# Patient Record
Sex: Female | Born: 1985 | Race: Black or African American | Hispanic: No | Marital: Single | Smoking: Never smoker
Health system: Southern US, Community
[De-identification: ages and names within clinical notes are randomized; demographics above are authoritative.]

## PROBLEM LIST (undated history)

## (undated) HISTORY — PX: RETINAL DETACHMENT SURGERY: SHX105

---

## 2004-07-11 ENCOUNTER — Emergency Department (HOSPITAL_COMMUNITY): Admission: EM | Admit: 2004-07-11 | Discharge: 2004-07-12 | Payer: Self-pay | Admitting: Emergency Medicine

## 2004-07-26 ENCOUNTER — Ambulatory Visit: Payer: Self-pay | Admitting: Family Medicine

## 2004-08-09 ENCOUNTER — Ambulatory Visit: Payer: Self-pay | Admitting: Family Medicine

## 2004-08-30 ENCOUNTER — Ambulatory Visit: Payer: Self-pay | Admitting: Family Medicine

## 2004-10-19 ENCOUNTER — Ambulatory Visit: Payer: Self-pay | Admitting: Family Medicine

## 2005-07-11 ENCOUNTER — Encounter: Payer: Self-pay | Admitting: Family Medicine

## 2005-07-11 ENCOUNTER — Ambulatory Visit: Payer: Self-pay | Admitting: *Deleted

## 2006-07-23 ENCOUNTER — Ambulatory Visit: Payer: Self-pay | Admitting: Obstetrics and Gynecology

## 2006-11-06 ENCOUNTER — Ambulatory Visit: Payer: Self-pay | Admitting: Family Medicine

## 2007-08-22 ENCOUNTER — Emergency Department (HOSPITAL_COMMUNITY): Admission: EM | Admit: 2007-08-22 | Discharge: 2007-08-22 | Payer: Self-pay | Admitting: Emergency Medicine

## 2010-08-22 ENCOUNTER — Other Ambulatory Visit
Admission: RE | Admit: 2010-08-22 | Discharge: 2010-08-22 | Payer: Self-pay | Source: Home / Self Care | Admitting: Family Medicine

## 2011-01-11 NOTE — Group Therapy Note (Signed)
[name], Stein NO.:  [id number]   MEDICAL RECORD NO.:  [id number]          PATIENT TYPE:  WOC   LOCATION[facility]                   FACILITY:  [facility]   PHYSICIAN:  Tinnie Gens, MD        DATE OF BIRTH:  1986-06-20   DATE OF SERVICE:  07/26/2004                                    CLINIC NOTE   CHIEF COMPLAINT:  Genital warts.   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  She was seen there on July 11, 2004 with an outbreak.  She had  previously had a Pap smear in October 2005 and it was thought she had  chlamydia at that time.  However, her partner was never treated and a test  of cure was negative at the [facility] ED on November 16.   PAST MEDICAL HISTORY:  Negative.   PAST SURGICAL HISTORY:  Negative.   MEDICATIONS:  None.   ALLERGIES:  PENICILLIN.   GYNECOLOGICAL HISTORY:  Menarche at age 32, light flow, mild pain with  periods.  On oral contraceptives.  No history of abnormal Pap smear.   OBSTETRICAL HISTORY:  G0.   FAMILY HISTORY:  Grandmother has diabetes.   SOCIAL HISTORY:  No tobacco, alcohol, or drug use.   REVIEW OF SYMPTOMS:  A 14-point review of systems reviewed and negative.   PHYSICAL EXAMINATION:  VITAL SIGNS:  As noted in the chart, her pulse is  137, weight is 110.  GENERAL:  She is a thin black female in no acute distress.  GENITOURINARY:  She has pocketing of warts all along the posterior  fourchette that were treated with 80% TCA after covering of surrounding  areas with K-Y Jelly.  The patient was instructed to clean this off in  approximately 30 minutes.   IMPRESSION:  Genital warts.   PLAN:  Treatment today and return in 2 weeks for further treatment as  needed.      TP/MEDQ  D:  07/26/2004  T:  07/26/2004  Job:  [id number]

## 2011-01-11 NOTE — Group Therapy Note (Signed)
[name], [name] NO.:  [id number]   MEDICAL RECORD NO.:  [id number]          PATIENT TYPE:  WOC   [facility]                   [facility]   PHYSICIAN:  Argentina Donovan, MD        DATE OF BIRTH:  1985-10-03   DATE OF SERVICE:  07/23/2006                                  CLINIC NOTE   CHIEF COMPLAINT:  The patient is here for her Pap smear.   HISTORY OF PRESENT ILLNESS:  The patient has no complaints, is here for  a Pap smear.  Had a Pap smear one year ago that was normal.  Denies any  vaginal discharge.  No abnormal vaginal bleeding.  No nipple discharge.  No breast pain.  No GI symptoms.   PAST MEDICAL HISTORY:  Noncontributory.   ALLERGIES:  PENICILLIN.   GYN HISTORY:  Last menstrual period was 2 weeks ago.  Last Pap smear  November 2006.  Has never had a mammogram.  No history of abnormal Pap  smear.  Was no Depo-Provera last year, stopped earlier this year.  Has  been using condoms for contraception.  Does not want any other  contraceptive option at this time.   MEDICATIONS:  None.   SOCIAL HISTORY:  The patient is a Consulting civil engineer at [employer].  Denies tobacco use, alcohol or drugs.   PHYSICAL EXAMINATION:  VITAL SIGNS:  A pulse of 92, blood pressure of  111/74, weight of 124.  GENERAL:  The patient was alert, in no acute distress.  CARDIOVASCULAR:  Regular rate and rhythm, no murmurs.  LUNGS:  Clear to auscultation bilaterally.  ABDOMEN:  Soft, nontender, nondistended.  Appropriate bowel sounds.  PELVIC:  No vaginal discharge.  Cervix appeared normal.  No lesions.  No  adnexal mass or tenderness.  No cervical motion tenderness.  Uterus was  small, anteverted, mobile.   ASSESSMENT AND PLAN:  1. Health maintenance.  The patient had a Pap smear, gonorrhea and      Chlamydia done today.  Will plan to send a letter to the patient      regarding her test results.  2. Discussed contraceptive options with patient.  Patient  declined.      Patient wants to continue with condoms as needed for contraception.   The patient is to follow up in one year for routine exam unless she has  any problems prior to that.     ______________________________  Wilburt Finlay, M.D.    ______________________________  Argentina Donovan, MD    LJ/MEDQ  D:  07/23/2006  T:  07/24/2006  Job:  [id number]

## 2011-01-11 NOTE — Group Therapy Note (Signed)
NAMECHAELI, JUDY NO.:  [id number]   MEDICAL RECORD NO.:  [id number]          PATIENT TYPE:  WOC   [facility]                   [facility]   PHYSICIAN:  Kathlyn Sacramento, M.D.   DATE OF BIRTH:  11-11-1985   DATE OF SERVICE:                                    CLINIC NOTE   CHIEF COMPLAINT:  Here for a Pap smear.   HISTORY OF PRESENT ILLNESS:  The patient states that she is here for a Pap  smear.  She has not had a Pap in one year.  She also states that she has a  history of migraines which she used physical therapy for in the past which  made them less frequent, but she did have one recently.  She also states  that she was told in the past she had protein in her urine, but was  menstruating during this time.   ALLERGIES:  To penicillin.   GYNECOLOGICAL HISTORY:  Last menstrual period:  She is on Depo.  Last Pap  smear in October of 2005.  Last mammogram:  Never.  She has had no history  of abnormal Pap smears.  She has had a history of chlamydia.  She has had 3  lifetime partners, one in the past year, and is not currently sexually  active.  She is a G0.   CURRENT MEDICATIONS:  None except Depo.   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   PHYSICAL EXAMINATION:  VITAL SIGNS:  Her temp is 98.4.  Her pulse is 105.  Her blood pressure is 126/73.  Her weight is 118.4 pounds, and she is 5 feet  4-1/2 inches.  GENERAL:  The patient is a well-developed, well-nourished female in no acute  distress.  GU:  She has normal external genitalia.  Her vaginal mucosa is rugated and  pink.  She has a normal-appearing cervix which is nulliparous.  A Pap smear  was done.  She had no cervical motion tenderness.  She had no adnexal  masses.  No uterine enlargement.   ASSESSMENT AND PLAN:  1.  Migraines.  The patient was instructed to follow up with her primary      care physician for further evaluation of this.  2.   History of proteinuria.  The patient likely had proteinuria secondary to      her menstrual period likely because she was menstruating.  Again, the      patient was told to follow up with primary care physician for this.  3.  Health maintenance.  Pap smear, gonorrhea and chlamydia were done.      Please send a copy to the patient as she needs a copy of her Pap smear      to continue getting the Depo-Provera at her school clinic.          ______________________________  Kathlyn Sacramento, M.D.    AC/MEDQ  D:  07/11/2005  T:  07/11/2005  Job:  [id number]

## 2011-01-11 NOTE — Group Therapy Note (Signed)
[name], PROVENCAL NO.:  [id number]   MEDICAL RECORD NO.:  [id number]          PATIENT TYPE:  WOC   [facility]                   [facility]   PHYSICIAN:  Tinnie Gens, MD        DATE OF BIRTH:  Jun 26, 1986   DATE OF SERVICE:  11/06/2006                                  CLINIC NOTE   CHIEF COMPLAINT:  Test of cure after testing positive for Gonorrhea.   HISTORY OF PRESENT ILLNESS:  The patient is a 25 year old nulligravida  who underwent annual exam with Pap smear in November 2007 which was  normal except her GC was positive.  She was treated with Cipro.  The  patient is here and back for a test of cure today.  She again declines  oral contraceptives.   PHYSICAL EXAMINATION:  VITAL SIGNS:  On exam, her vitals are as noted on  the chart.  GENERAL:  She is a well-developed, well-nourished female in no acute  distress.  ABDOMEN:  Soft, nontender, nondistended.  GU:  Normal external female genitalia.  Vagina is pink and rugated.  Cervix is nulliparous without lesion.  EXTREMITIES:  Also, on exam, on her right wrist she has a ganglion cyst.   IMPRESSION:  1. Sexual behavior resulting in positive Gonorrhea Chlamydia.  I gave      her a test of cure.  2. Ganglion cyst.   PLAN:  1. GC Chlamydia testing today.  I offered HIV and Syphilis; she      declines.  2. We will refer her to a hand surgeon to take off her ganglion.           ______________________________  Tinnie Gens, MD     TP/MEDQ  D:  11/06/2006  T:  11/08/2006  Job:  [id number]

## 2011-01-11 NOTE — Group Therapy Note (Signed)
[name] NO.:  [id number]   MEDICAL RECORD NO.:  [id number]          PATIENT TYPE:  WOC   [facility]                   [facility]   PHYSICIAN:  Tinnie Gens, MD        DATE OF BIRTH:  01-Nov-1985   DATE OF SERVICE:                                    CLINIC NOTE   CHIEF COMPLAINT:  STD check, birth-control consult.   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  She  was previously treated for genital warts which have resolved.  She wants a  complete STD check.  She has also been off birth control pills, and would  like to discuss other methods.   PHYSICAL EXAMINATION:  VITAL SIGNS:  Her vitals are as noted on the chart.  GENERAL:  She is a well-developed, well-nourished black female in no acute  distress.  GU:  Normal external female genitalia.  The vagina is pink and rugated.  The  cervix is nulliparous and without  lesions.  The uterus is small and  anteverted.  There is no significant cervical motion tenderness.   IMPRESSION:  1.  High-risk sexual behavior.  2.  Birth-control consult.   PLAN:  1.  GC and chlamydia screen today.  The patient did have a negative HIV in      December so we will not repeat that today.  2.  I discussed Nuva ring, birth control pill, Depo-Provera and the patch      with the patient.  She is comfortable using the pill, has been on it      before, and will start after her next normal cycle.  3.  The patient was instructed to return with any further needs that we can      assist with.      TP/MEDQ  D:  10/19/2004  T:  10/19/2004  Job:  [id number]

## 2011-05-31 LAB — I-STAT 8, (EC8 V) (CONVERTED LAB)
BUN: 7
Bicarbonate: 26.8 — ABNORMAL HIGH
HCT: 49 — ABNORMAL HIGH
Operator id: 257131
pCO2, Ven: 47.5
pH, Ven: 7.359 — ABNORMAL HIGH

## 2011-05-31 LAB — DIFFERENTIAL
Basophils Absolute: 0
Eosinophils Absolute: 0.2
Eosinophils Relative: 1
Monocytes Absolute: 0.7

## 2011-05-31 LAB — URINALYSIS, ROUTINE W REFLEX MICROSCOPIC
Hgb urine dipstick: NEGATIVE
Ketones, ur: 15 — AB
Protein, ur: NEGATIVE
Urobilinogen, UA: 0.2

## 2011-05-31 LAB — POCT I-STAT CREATININE
Creatinine, Ser: 0.9
Operator id: 257131

## 2011-05-31 LAB — GC/CHLAMYDIA PROBE AMP, GENITAL
Chlamydia, DNA Probe: NEGATIVE
GC Probe Amp, Genital: POSITIVE — AB

## 2011-05-31 LAB — RPR
RPR Ser Ql: NONREACTIVE
RPR Ser Ql: NONREACTIVE

## 2011-05-31 LAB — WET PREP, GENITAL: Yeast Wet Prep HPF POC: NONE SEEN

## 2011-05-31 LAB — CBC
HCT: 41.5
MCV: 87.7
Platelets: 457 — ABNORMAL HIGH
RDW: 13.4

## 2011-09-16 ENCOUNTER — Other Ambulatory Visit (HOSPITAL_COMMUNITY)
Admission: RE | Admit: 2011-09-16 | Discharge: 2011-09-16 | Disposition: A | Discharge status: Home / Self Care | Payer: BC Managed Care – PPO | Source: Ambulatory Visit | Attending: Family Medicine | Admitting: Family Medicine

## 2011-09-16 DIAGNOSIS — Z124 Encounter for screening for malignant neoplasm of cervix: Secondary | ICD-10-CM | POA: Insufficient documentation

## 2012-06-30 ENCOUNTER — Other Ambulatory Visit: Payer: Self-pay | Admitting: Family Medicine

## 2012-07-17 ENCOUNTER — Ambulatory Visit
Admission: RE | Admit: 2012-07-17 | Discharge: 2012-07-17 | Disposition: A | Discharge status: Home / Self Care | Payer: BC Managed Care – PPO | Source: Ambulatory Visit | Attending: Family Medicine | Admitting: Family Medicine

## 2013-09-20 ENCOUNTER — Other Ambulatory Visit (HOSPITAL_COMMUNITY)
Admission: RE | Admit: 2013-09-20 | Discharge: 2013-09-20 | Disposition: A | Discharge status: Home / Self Care | Payer: BC Managed Care – PPO | Source: Ambulatory Visit | Attending: Family Medicine | Admitting: Family Medicine

## 2013-09-20 ENCOUNTER — Other Ambulatory Visit: Payer: Self-pay | Admitting: Family Medicine

## 2013-09-20 DIAGNOSIS — Z124 Encounter for screening for malignant neoplasm of cervix: Secondary | ICD-10-CM | POA: Insufficient documentation
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
## 2016-01-01 DIAGNOSIS — E559 Vitamin D deficiency, unspecified: Secondary | ICD-10-CM | POA: Diagnosis not present

## 2016-05-09 DIAGNOSIS — Z23 Encounter for immunization: Secondary | ICD-10-CM | POA: Diagnosis not present

## 2016-05-24 ENCOUNTER — Encounter: Payer: No Typology Code available for payment source | Admitting: Internal Medicine

## 2016-09-05 DIAGNOSIS — F4323 Adjustment disorder with mixed anxiety and depressed mood: Secondary | ICD-10-CM | POA: Diagnosis not present

## 2016-09-20 DIAGNOSIS — F4323 Adjustment disorder with mixed anxiety and depressed mood: Secondary | ICD-10-CM | POA: Diagnosis not present

## 2016-09-30 DIAGNOSIS — F4323 Adjustment disorder with mixed anxiety and depressed mood: Secondary | ICD-10-CM | POA: Diagnosis not present

## 2016-10-03 ENCOUNTER — Other Ambulatory Visit: Payer: Self-pay | Admitting: Family Medicine

## 2016-10-03 ENCOUNTER — Other Ambulatory Visit (HOSPITAL_COMMUNITY)
Admission: RE | Admit: 2016-10-03 | Discharge: 2016-10-03 | Disposition: A | Discharge status: Home / Self Care | Payer: BLUE CROSS/BLUE SHIELD | Source: Ambulatory Visit | Attending: Family Medicine | Admitting: Family Medicine

## 2016-10-03 DIAGNOSIS — Z1151 Encounter for screening for human papillomavirus (HPV): Secondary | ICD-10-CM | POA: Diagnosis not present

## 2016-10-03 DIAGNOSIS — Z800 Family history of malignant neoplasm of digestive organs: Secondary | ICD-10-CM | POA: Diagnosis not present

## 2016-10-03 DIAGNOSIS — Z01411 Encounter for gynecological examination (general) (routine) with abnormal findings: Secondary | ICD-10-CM | POA: Diagnosis not present

## 2016-10-03 DIAGNOSIS — Z0000 Encounter for general adult medical examination without abnormal findings: Secondary | ICD-10-CM | POA: Diagnosis not present

## 2016-10-03 DIAGNOSIS — D473 Essential (hemorrhagic) thrombocythemia: Secondary | ICD-10-CM | POA: Diagnosis not present

## 2016-10-03 DIAGNOSIS — E559 Vitamin D deficiency, unspecified: Secondary | ICD-10-CM | POA: Diagnosis not present

## 2016-10-08 LAB — CYTOLOGY - PAP
Diagnosis: NEGATIVE
HPV (WINDOPATH): NOT DETECTED

## 2016-10-14 DIAGNOSIS — H5213 Myopia, bilateral: Secondary | ICD-10-CM | POA: Diagnosis not present

## 2016-10-14 DIAGNOSIS — H52223 Regular astigmatism, bilateral: Secondary | ICD-10-CM | POA: Diagnosis not present

## 2016-10-17 DIAGNOSIS — F4323 Adjustment disorder with mixed anxiety and depressed mood: Secondary | ICD-10-CM | POA: Diagnosis not present

## 2016-11-14 DIAGNOSIS — Z23 Encounter for immunization: Secondary | ICD-10-CM | POA: Diagnosis not present

## 2016-11-20 DIAGNOSIS — D473 Essential (hemorrhagic) thrombocythemia: Secondary | ICD-10-CM | POA: Diagnosis not present

## 2017-04-22 DIAGNOSIS — L918 Other hypertrophic disorders of the skin: Secondary | ICD-10-CM | POA: Diagnosis not present

## 2017-04-22 DIAGNOSIS — L819 Disorder of pigmentation, unspecified: Secondary | ICD-10-CM | POA: Diagnosis not present

## 2017-09-15 DIAGNOSIS — K921 Melena: Secondary | ICD-10-CM | POA: Diagnosis not present

## 2017-09-25 DIAGNOSIS — R195 Other fecal abnormalities: Secondary | ICD-10-CM | POA: Diagnosis not present

## 2017-10-06 DIAGNOSIS — Z0000 Encounter for general adult medical examination without abnormal findings: Secondary | ICD-10-CM | POA: Diagnosis not present

## 2017-10-06 DIAGNOSIS — E559 Vitamin D deficiency, unspecified: Secondary | ICD-10-CM | POA: Diagnosis not present

## 2017-10-28 DIAGNOSIS — R195 Other fecal abnormalities: Secondary | ICD-10-CM | POA: Diagnosis not present

## 2017-11-17 DIAGNOSIS — R195 Other fecal abnormalities: Secondary | ICD-10-CM | POA: Diagnosis not present

## 2017-11-21 DIAGNOSIS — R195 Other fecal abnormalities: Secondary | ICD-10-CM | POA: Diagnosis not present

## 2017-11-21 DIAGNOSIS — K648 Other hemorrhoids: Secondary | ICD-10-CM | POA: Diagnosis not present

## 2017-11-21 DIAGNOSIS — K635 Polyp of colon: Secondary | ICD-10-CM | POA: Diagnosis not present

## 2017-11-25 DIAGNOSIS — K635 Polyp of colon: Secondary | ICD-10-CM | POA: Diagnosis not present

## 2018-01-02 DIAGNOSIS — H52223 Regular astigmatism, bilateral: Secondary | ICD-10-CM | POA: Diagnosis not present

## 2018-01-02 DIAGNOSIS — H5213 Myopia, bilateral: Secondary | ICD-10-CM | POA: Diagnosis not present

## 2019-12-06 ENCOUNTER — Other Ambulatory Visit: Payer: Self-pay | Admitting: Family Medicine

## 2019-12-06 ENCOUNTER — Other Ambulatory Visit (HOSPITAL_COMMUNITY)
Admission: RE | Admit: 2019-12-06 | Discharge: 2019-12-06 | Disposition: A | Discharge status: Home / Self Care | Payer: PRIVATE HEALTH INSURANCE | Source: Ambulatory Visit | Attending: Family Medicine | Admitting: Family Medicine

## 2019-12-06 DIAGNOSIS — Z01411 Encounter for gynecological examination (general) (routine) with abnormal findings: Secondary | ICD-10-CM | POA: Insufficient documentation

## 2019-12-09 LAB — CYTOLOGY - PAP: Diagnosis: NEGATIVE

## 2020-10-01 DIAGNOSIS — J208 Acute bronchitis due to other specified organisms: Secondary | ICD-10-CM | POA: Diagnosis not present

## 2020-10-03 DIAGNOSIS — R051 Acute cough: Secondary | ICD-10-CM | POA: Diagnosis not present

## 2020-12-11 DIAGNOSIS — F4320 Adjustment disorder, unspecified: Secondary | ICD-10-CM | POA: Diagnosis not present

## 2020-12-11 DIAGNOSIS — Z630 Problems in relationship with spouse or partner: Secondary | ICD-10-CM | POA: Diagnosis not present

## 2020-12-18 DIAGNOSIS — F4320 Adjustment disorder, unspecified: Secondary | ICD-10-CM | POA: Diagnosis not present

## 2020-12-18 DIAGNOSIS — Z630 Problems in relationship with spouse or partner: Secondary | ICD-10-CM | POA: Diagnosis not present

## 2020-12-25 DIAGNOSIS — Z630 Problems in relationship with spouse or partner: Secondary | ICD-10-CM | POA: Diagnosis not present

## 2020-12-25 DIAGNOSIS — F4320 Adjustment disorder, unspecified: Secondary | ICD-10-CM | POA: Diagnosis not present

## 2021-01-01 DIAGNOSIS — Z23 Encounter for immunization: Secondary | ICD-10-CM | POA: Diagnosis not present

## 2021-01-01 DIAGNOSIS — Z0000 Encounter for general adult medical examination without abnormal findings: Secondary | ICD-10-CM | POA: Diagnosis not present

## 2021-01-01 DIAGNOSIS — Z13220 Encounter for screening for lipoid disorders: Secondary | ICD-10-CM | POA: Diagnosis not present

## 2021-01-01 DIAGNOSIS — E559 Vitamin D deficiency, unspecified: Secondary | ICD-10-CM | POA: Diagnosis not present

## 2021-01-02 DIAGNOSIS — F4320 Adjustment disorder, unspecified: Secondary | ICD-10-CM | POA: Diagnosis not present

## 2021-01-02 DIAGNOSIS — Z630 Problems in relationship with spouse or partner: Secondary | ICD-10-CM | POA: Diagnosis not present

## 2021-01-08 DIAGNOSIS — Z630 Problems in relationship with spouse or partner: Secondary | ICD-10-CM | POA: Diagnosis not present

## 2021-01-08 DIAGNOSIS — F4320 Adjustment disorder, unspecified: Secondary | ICD-10-CM | POA: Diagnosis not present

## 2021-01-22 DIAGNOSIS — Z630 Problems in relationship with spouse or partner: Secondary | ICD-10-CM | POA: Diagnosis not present

## 2021-01-22 DIAGNOSIS — F4320 Adjustment disorder, unspecified: Secondary | ICD-10-CM | POA: Diagnosis not present

## 2021-01-29 DIAGNOSIS — F4320 Adjustment disorder, unspecified: Secondary | ICD-10-CM | POA: Diagnosis not present

## 2021-01-29 DIAGNOSIS — Z630 Problems in relationship with spouse or partner: Secondary | ICD-10-CM | POA: Diagnosis not present

## 2021-02-05 DIAGNOSIS — Z630 Problems in relationship with spouse or partner: Secondary | ICD-10-CM | POA: Diagnosis not present

## 2021-02-05 DIAGNOSIS — F4320 Adjustment disorder, unspecified: Secondary | ICD-10-CM | POA: Diagnosis not present

## 2021-02-12 DIAGNOSIS — Z630 Problems in relationship with spouse or partner: Secondary | ICD-10-CM | POA: Diagnosis not present

## 2021-02-12 DIAGNOSIS — F4320 Adjustment disorder, unspecified: Secondary | ICD-10-CM | POA: Diagnosis not present

## 2021-02-21 DIAGNOSIS — F4320 Adjustment disorder, unspecified: Secondary | ICD-10-CM | POA: Diagnosis not present

## 2021-02-21 DIAGNOSIS — Z630 Problems in relationship with spouse or partner: Secondary | ICD-10-CM | POA: Diagnosis not present

## 2021-03-01 DIAGNOSIS — F4320 Adjustment disorder, unspecified: Secondary | ICD-10-CM | POA: Diagnosis not present

## 2021-03-01 DIAGNOSIS — Z630 Problems in relationship with spouse or partner: Secondary | ICD-10-CM | POA: Diagnosis not present

## 2021-03-05 DIAGNOSIS — F4320 Adjustment disorder, unspecified: Secondary | ICD-10-CM | POA: Diagnosis not present

## 2021-03-05 DIAGNOSIS — Z630 Problems in relationship with spouse or partner: Secondary | ICD-10-CM | POA: Diagnosis not present

## 2021-03-12 DIAGNOSIS — Z630 Problems in relationship with spouse or partner: Secondary | ICD-10-CM | POA: Diagnosis not present

## 2021-03-12 DIAGNOSIS — F4320 Adjustment disorder, unspecified: Secondary | ICD-10-CM | POA: Diagnosis not present

## 2021-04-09 DIAGNOSIS — F4320 Adjustment disorder, unspecified: Secondary | ICD-10-CM | POA: Diagnosis not present

## 2021-04-16 DIAGNOSIS — F4320 Adjustment disorder, unspecified: Secondary | ICD-10-CM | POA: Diagnosis not present

## 2021-04-25 DIAGNOSIS — F4320 Adjustment disorder, unspecified: Secondary | ICD-10-CM | POA: Diagnosis not present

## 2021-05-02 DIAGNOSIS — F4320 Adjustment disorder, unspecified: Secondary | ICD-10-CM | POA: Diagnosis not present

## 2021-05-09 DIAGNOSIS — F4320 Adjustment disorder, unspecified: Secondary | ICD-10-CM | POA: Diagnosis not present

## 2021-05-16 DIAGNOSIS — F4320 Adjustment disorder, unspecified: Secondary | ICD-10-CM | POA: Diagnosis not present

## 2021-05-23 DIAGNOSIS — F4320 Adjustment disorder, unspecified: Secondary | ICD-10-CM | POA: Diagnosis not present

## 2021-05-30 DIAGNOSIS — F4320 Adjustment disorder, unspecified: Secondary | ICD-10-CM | POA: Diagnosis not present

## 2021-06-04 DIAGNOSIS — F4320 Adjustment disorder, unspecified: Secondary | ICD-10-CM | POA: Diagnosis not present

## 2021-06-11 DIAGNOSIS — F4320 Adjustment disorder, unspecified: Secondary | ICD-10-CM | POA: Diagnosis not present

## 2021-06-18 DIAGNOSIS — F4320 Adjustment disorder, unspecified: Secondary | ICD-10-CM | POA: Diagnosis not present

## 2021-06-25 DIAGNOSIS — F4320 Adjustment disorder, unspecified: Secondary | ICD-10-CM | POA: Diagnosis not present

## 2021-07-02 DIAGNOSIS — F4320 Adjustment disorder, unspecified: Secondary | ICD-10-CM | POA: Diagnosis not present

## 2021-07-11 DIAGNOSIS — F4320 Adjustment disorder, unspecified: Secondary | ICD-10-CM | POA: Diagnosis not present

## 2021-07-30 DIAGNOSIS — F4320 Adjustment disorder, unspecified: Secondary | ICD-10-CM | POA: Diagnosis not present

## 2021-08-09 DIAGNOSIS — F4320 Adjustment disorder, unspecified: Secondary | ICD-10-CM | POA: Diagnosis not present

## 2021-08-13 DIAGNOSIS — F4320 Adjustment disorder, unspecified: Secondary | ICD-10-CM | POA: Diagnosis not present

## 2021-08-30 DIAGNOSIS — F4320 Adjustment disorder, unspecified: Secondary | ICD-10-CM | POA: Diagnosis not present

## 2021-09-06 DIAGNOSIS — F4320 Adjustment disorder, unspecified: Secondary | ICD-10-CM | POA: Diagnosis not present

## 2021-09-13 DIAGNOSIS — F4320 Adjustment disorder, unspecified: Secondary | ICD-10-CM | POA: Diagnosis not present

## 2021-09-17 DIAGNOSIS — F4320 Adjustment disorder, unspecified: Secondary | ICD-10-CM | POA: Diagnosis not present

## 2021-09-24 DIAGNOSIS — F4320 Adjustment disorder, unspecified: Secondary | ICD-10-CM | POA: Diagnosis not present

## 2021-10-01 DIAGNOSIS — F4320 Adjustment disorder, unspecified: Secondary | ICD-10-CM | POA: Diagnosis not present

## 2021-10-08 DIAGNOSIS — F4320 Adjustment disorder, unspecified: Secondary | ICD-10-CM | POA: Diagnosis not present

## 2021-10-15 DIAGNOSIS — F4320 Adjustment disorder, unspecified: Secondary | ICD-10-CM | POA: Diagnosis not present

## 2021-10-22 DIAGNOSIS — F4320 Adjustment disorder, unspecified: Secondary | ICD-10-CM | POA: Diagnosis not present

## 2021-10-31 DIAGNOSIS — F4320 Adjustment disorder, unspecified: Secondary | ICD-10-CM | POA: Diagnosis not present

## 2021-11-05 DIAGNOSIS — F4320 Adjustment disorder, unspecified: Secondary | ICD-10-CM | POA: Diagnosis not present

## 2021-11-14 DIAGNOSIS — F4320 Adjustment disorder, unspecified: Secondary | ICD-10-CM | POA: Diagnosis not present

## 2021-11-20 DIAGNOSIS — F4320 Adjustment disorder, unspecified: Secondary | ICD-10-CM | POA: Diagnosis not present

## 2021-11-27 DIAGNOSIS — F4320 Adjustment disorder, unspecified: Secondary | ICD-10-CM | POA: Diagnosis not present

## 2021-12-03 DIAGNOSIS — F4323 Adjustment disorder with mixed anxiety and depressed mood: Secondary | ICD-10-CM | POA: Diagnosis not present

## 2021-12-03 DIAGNOSIS — F4320 Adjustment disorder, unspecified: Secondary | ICD-10-CM | POA: Diagnosis not present

## 2021-12-11 DIAGNOSIS — F4320 Adjustment disorder, unspecified: Secondary | ICD-10-CM | POA: Diagnosis not present

## 2021-12-20 DIAGNOSIS — F4320 Adjustment disorder, unspecified: Secondary | ICD-10-CM | POA: Diagnosis not present

## 2021-12-24 DIAGNOSIS — F4320 Adjustment disorder, unspecified: Secondary | ICD-10-CM | POA: Diagnosis not present

## 2021-12-31 DIAGNOSIS — F4323 Adjustment disorder with mixed anxiety and depressed mood: Secondary | ICD-10-CM | POA: Diagnosis not present

## 2022-01-02 DIAGNOSIS — F4320 Adjustment disorder, unspecified: Secondary | ICD-10-CM | POA: Diagnosis not present

## 2022-01-07 DIAGNOSIS — F4323 Adjustment disorder with mixed anxiety and depressed mood: Secondary | ICD-10-CM | POA: Diagnosis not present

## 2022-01-14 DIAGNOSIS — F4320 Adjustment disorder, unspecified: Secondary | ICD-10-CM | POA: Diagnosis not present

## 2022-01-14 DIAGNOSIS — F4323 Adjustment disorder with mixed anxiety and depressed mood: Secondary | ICD-10-CM | POA: Diagnosis not present

## 2022-01-16 DIAGNOSIS — Z13220 Encounter for screening for lipoid disorders: Secondary | ICD-10-CM | POA: Diagnosis not present

## 2022-01-16 DIAGNOSIS — Z0000 Encounter for general adult medical examination without abnormal findings: Secondary | ICD-10-CM | POA: Diagnosis not present

## 2022-01-16 DIAGNOSIS — E559 Vitamin D deficiency, unspecified: Secondary | ICD-10-CM | POA: Diagnosis not present

## 2022-01-16 DIAGNOSIS — D649 Anemia, unspecified: Secondary | ICD-10-CM | POA: Diagnosis not present

## 2022-01-22 DIAGNOSIS — F4323 Adjustment disorder with mixed anxiety and depressed mood: Secondary | ICD-10-CM | POA: Diagnosis not present

## 2022-01-23 DIAGNOSIS — F4320 Adjustment disorder, unspecified: Secondary | ICD-10-CM | POA: Diagnosis not present

## 2022-02-04 DIAGNOSIS — F4320 Adjustment disorder, unspecified: Secondary | ICD-10-CM | POA: Diagnosis not present

## 2022-02-11 DIAGNOSIS — F4320 Adjustment disorder, unspecified: Secondary | ICD-10-CM | POA: Diagnosis not present

## 2022-03-04 DIAGNOSIS — F4320 Adjustment disorder, unspecified: Secondary | ICD-10-CM | POA: Diagnosis not present

## 2022-03-25 DIAGNOSIS — F4320 Adjustment disorder, unspecified: Secondary | ICD-10-CM | POA: Diagnosis not present

## 2022-04-01 DIAGNOSIS — H6122 Impacted cerumen, left ear: Secondary | ICD-10-CM | POA: Diagnosis not present

## 2022-04-08 DIAGNOSIS — F4320 Adjustment disorder, unspecified: Secondary | ICD-10-CM | POA: Diagnosis not present

## 2022-05-20 DIAGNOSIS — F4320 Adjustment disorder, unspecified: Secondary | ICD-10-CM | POA: Diagnosis not present

## 2022-06-10 DIAGNOSIS — F4320 Adjustment disorder, unspecified: Secondary | ICD-10-CM | POA: Diagnosis not present

## 2022-07-01 DIAGNOSIS — F4320 Adjustment disorder, unspecified: Secondary | ICD-10-CM | POA: Diagnosis not present

## 2022-07-29 DIAGNOSIS — F4320 Adjustment disorder, unspecified: Secondary | ICD-10-CM | POA: Diagnosis not present

## 2022-09-02 DIAGNOSIS — F4320 Adjustment disorder, unspecified: Secondary | ICD-10-CM | POA: Diagnosis not present

## 2022-09-30 DIAGNOSIS — F4320 Adjustment disorder, unspecified: Secondary | ICD-10-CM | POA: Diagnosis not present

## 2022-10-28 DIAGNOSIS — F4320 Adjustment disorder, unspecified: Secondary | ICD-10-CM | POA: Diagnosis not present

## 2022-11-27 DIAGNOSIS — F4320 Adjustment disorder, unspecified: Secondary | ICD-10-CM | POA: Diagnosis not present

## 2022-12-30 DIAGNOSIS — F4320 Adjustment disorder, unspecified: Secondary | ICD-10-CM | POA: Diagnosis not present

## 2023-01-27 DIAGNOSIS — F4320 Adjustment disorder, unspecified: Secondary | ICD-10-CM | POA: Diagnosis not present

## 2023-02-07 DIAGNOSIS — F4320 Adjustment disorder, unspecified: Secondary | ICD-10-CM | POA: Diagnosis not present

## 2023-02-13 DIAGNOSIS — E559 Vitamin D deficiency, unspecified: Secondary | ICD-10-CM | POA: Diagnosis not present

## 2023-02-13 DIAGNOSIS — Z13220 Encounter for screening for lipoid disorders: Secondary | ICD-10-CM | POA: Diagnosis not present

## 2023-02-13 DIAGNOSIS — D649 Anemia, unspecified: Secondary | ICD-10-CM | POA: Diagnosis not present

## 2023-02-13 DIAGNOSIS — F4320 Adjustment disorder, unspecified: Secondary | ICD-10-CM | POA: Diagnosis not present

## 2023-02-13 DIAGNOSIS — Z0000 Encounter for general adult medical examination without abnormal findings: Secondary | ICD-10-CM | POA: Diagnosis not present

## 2023-02-14 DIAGNOSIS — F4320 Adjustment disorder, unspecified: Secondary | ICD-10-CM | POA: Diagnosis not present

## 2023-02-17 ENCOUNTER — Other Ambulatory Visit: Payer: Self-pay | Admitting: Family Medicine

## 2023-02-17 DIAGNOSIS — N946 Dysmenorrhea, unspecified: Secondary | ICD-10-CM

## 2023-03-05 ENCOUNTER — Ambulatory Visit
Admission: RE | Admit: 2023-03-05 | Discharge: 2023-03-05 | Disposition: A | Discharge status: Home / Self Care | Payer: 59 | Source: Ambulatory Visit | Attending: Family Medicine | Admitting: Family Medicine

## 2023-03-05 DIAGNOSIS — N946 Dysmenorrhea, unspecified: Secondary | ICD-10-CM

## 2023-03-20 ENCOUNTER — Inpatient Hospital Stay: Payer: Managed Care, Other (non HMO)

## 2023-03-20 ENCOUNTER — Encounter: Payer: Self-pay | Admitting: Genetic Counselor

## 2023-03-20 ENCOUNTER — Inpatient Hospital Stay: Payer: 59 | Attending: Genetic Counselor | Admitting: Genetic Counselor

## 2023-03-20 DIAGNOSIS — Z800 Family history of malignant neoplasm of digestive organs: Secondary | ICD-10-CM

## 2023-03-20 DIAGNOSIS — Z803 Family history of malignant neoplasm of breast: Secondary | ICD-10-CM

## 2023-03-20 NOTE — Progress Notes (Signed)
REFERRING PROVIDER: Mila Palmer, MD   PRIMARY PROVIDER:  Patient, [name]  PRIMARY REASON FOR VISIT:  Encounter Diagnoses  Name Primary?   Family history of breast cancer Yes   Family history of stomach cancer    HISTORY OF PRESENT ILLNESS:   [name], a 37 y.o. female, was seen for a [place] cancer genetics consultation at the request of Dr. Paulino Rily due to a family history of cancer.  [name] presents to clinic today to discuss the possibility of a hereditary predisposition to cancer, to discuss genetic testing, and to further clarify her future cancer risks, as well as potential cancer risks for family members.   [name] is a 37 y.o. female with no personal history of cancer.    RISK FACTORS:  Menarche was at age 39.  First live birth at age n/a.  OCP use for approximately 2-3 years.  Ovaries intact: yes.  Uterus intact: yes.  Menopausal status: premenopausal.  HRT use: 0 years. Colonoscopy: yes;  reports benign polyps . Mammogram within the last year: no. Number of breast biopsies: 0. Any excessive radiation exposure in the past: no  Social History   Socioeconomic History   Marital status: Single    Spouse name: Not on file   Number of children: Not on file   Years of education: Not on file   Highest education level: Not on file  Occupational History   Not on file  Tobacco Use   Smoking status: Not on file   Smokeless tobacco: Not on file  Substance and Sexual Activity   Alcohol use: Not on file   Drug use: Not on file   Sexual activity: Not on file  Other Topics Concern   Not on file  Social History Narrative   Not on file   Social Determinants of Health   Financial Resource Strain: Not on file  Food Insecurity: Not on file  Transportation Needs: Not on file  Physical Activity: Not on file  Stress: Not on file  Social Connections: Unknown (01/08/2022)   Received from [facility]   Social Network    Social  Network: Not on file     FAMILY HISTORY:  We obtained a detailed, 4-generation family history.  Significant diagnoses are listed below: Family History  Problem Relation Age of Onset   Stomach cancer Father 84   Breast cancer Maternal Aunt 51       Negative genetic testing   Breast cancer Other 7       maternal half-aunt   Prostate cancer Other        maternal great uncle   Cancer Maternal Great-grandmother        unknown type      GENETIC COUNSELING ASSESSMENT: [name] is a 37 y.o. female with a family history of cancer which is somewhat suggestive of a hereditary predisposition to cancer. We, therefore, discussed and recommended the following at today's visit.   DISCUSSION: We discussed that 5 - 10% of cancer is hereditary, with most cases of hereditary breast cancer associated with BRCA1/2.  There are other genes that can be associated with hereditary breast cancer syndromes.  We discussed that testing is beneficial for several reasons, including knowing about other cancer risks, identifying potential screening and risk-reduction options that may be appropriate, and to understanding if other family members could be at risk for cancer and allowing them to undergo genetic testing.  We reviewed the characteristics, features and inheritance patterns of hereditary  cancer syndromes. We also discussed genetic testing, including the appropriate family members to test, the process of testing, insurance coverage and turn-around-time for results. We discussed the implications of a negative, positive, carrier and/or variant of uncertain significant result. We discussed that negative results would be uninformative given that [name] does not have a personal history of cancer.   Tyrer-Cuzick Risk Model: Multiple prediction models have been developed to assist with breast cancer risk prediction efforts for unaffected women without a known single gene risk factor identified in their  family. The Tyrer-Cuzick model is one such risk assessment tool. This model was developed to include extensive family history information, endogenous estrogen exposure, and benign breast disease. The calculation is highly-dependent on the accuracy of clinical data provided by the patient. Other factors not accounted for in the calculation may impact lifetime breast cancer risk including, but not limited to, germline mutations not analyzed by the ordered genetic test or clinical information not provided at the time of the testing. The risk number provided is patient-specific and cannot be used to infer risk to relatives.  [name] Tyrer-Cuzick risk score is 15.8%. This is above the general population risk of 12%, therefore, she is encouraged to continue to be mindful of her family history and be diligent with general population breast screening, including annual mammograms beginning 10 years prior to the youngest diagnosis in her family or by age 62.  She is encouraged to contact us regarding any changes to her personal or family history, as her recommendations for screening would be altered significantly if her lifetime risk is determined to be greater than 20% based on updated information.   PLAN: Based on [name]'s family history, we recommended her maternal half- aunt, who was diagnosed with breast cancer consider genetic counseling and testing. [name] will let us know if we can be of any assistance in coordinating genetic counseling and/or testing for this family member.   [name] questions were answered to her satisfaction today. Our contact information was provided should additional questions or concerns arise. Thank you for the referral and allowing [place] to share in the care of your patient.   Lalla Brothers, MS, [facility] Genetic Counselor [email or web address] (P) [phone]  The patient was seen for a total of 35 minutes in face-to-face genetic  counseling. The patient was seen alone.  Drs. Pamelia Hoit and/or Mosetta Putt were available to discuss this case as needed.  _______________________________________________________________________ For Office Staff:  Number of people involved in session: 1 Was an Intern/ student involved with case: no

## 2023-03-24 ENCOUNTER — Encounter: Payer: Self-pay | Admitting: Genetic Counselor

## 2023-03-24 DIAGNOSIS — Z803 Family history of malignant neoplasm of breast: Secondary | ICD-10-CM | POA: Insufficient documentation

## 2023-05-26 ENCOUNTER — Other Ambulatory Visit: Payer: Self-pay | Admitting: Obstetrics and Gynecology

## 2023-05-26 ENCOUNTER — Other Ambulatory Visit (HOSPITAL_COMMUNITY)
Admission: RE | Admit: 2023-05-26 | Discharge: 2023-05-26 | Disposition: A | Discharge status: Home / Self Care | Payer: Managed Care, Other (non HMO) | Source: Ambulatory Visit | Attending: Obstetrics and Gynecology | Admitting: Obstetrics and Gynecology

## 2023-05-26 DIAGNOSIS — Z124 Encounter for screening for malignant neoplasm of cervix: Secondary | ICD-10-CM | POA: Insufficient documentation

## 2023-06-02 LAB — CYTOLOGY - PAP
Comment: NEGATIVE
Diagnosis: NEGATIVE
High risk HPV: NEGATIVE

## 2023-10-20 ENCOUNTER — Other Ambulatory Visit: Payer: Self-pay | Admitting: Family Medicine

## 2023-10-20 DIAGNOSIS — N6311 Unspecified lump in the right breast, upper outer quadrant: Secondary | ICD-10-CM

## 2023-10-30 ENCOUNTER — Ambulatory Visit
Admission: RE | Admit: 2023-10-30 | Discharge: 2023-10-30 | Disposition: A | Discharge status: Home / Self Care | Payer: Managed Care, Other (non HMO) | Source: Ambulatory Visit | Attending: Family Medicine | Admitting: Family Medicine

## 2023-10-30 DIAGNOSIS — N6311 Unspecified lump in the right breast, upper outer quadrant: Secondary | ICD-10-CM

## 2023-12-23 ENCOUNTER — Telehealth: Payer: Self-pay | Admitting: Family Medicine

## 2023-12-23 NOTE — Telephone Encounter (Signed)
 Patient called in regarding her referral and getting scheduled with us . I informed patient that we have not received her referral but I can go ahead and schedule her appointment. Patient refused it and stated she will call Dr Wynona Hedger office again regarding a referral and that we have to be in connect so we can received the referral. Informed patient of my name and offered fax number. She did not want fax number saying the other office already has it

## 2024-01-07 ENCOUNTER — Encounter: Payer: Self-pay | Admitting: Obstetrics and Gynecology

## 2024-01-07 ENCOUNTER — Ambulatory Visit: Admitting: Obstetrics and Gynecology

## 2024-01-07 VITALS — BP 112/75 | HR 84 | Ht 65.0 in | Wt 178.0 lb

## 2024-01-07 DIAGNOSIS — N920 Excessive and frequent menstruation with regular cycle: Secondary | ICD-10-CM

## 2024-01-07 DIAGNOSIS — D250 Submucous leiomyoma of uterus: Secondary | ICD-10-CM | POA: Diagnosis not present

## 2024-01-07 DIAGNOSIS — D251 Intramural leiomyoma of uterus: Secondary | ICD-10-CM

## 2024-01-07 DIAGNOSIS — D259 Leiomyoma of uterus, unspecified: Secondary | ICD-10-CM | POA: Insufficient documentation

## 2024-01-07 NOTE — Progress Notes (Signed)
 Pt was referred to discuss fibroid uterus - from Dr Wynona Hedger. Please see notes under Care everywhere.  Pt states her cycles are heavy and last 5-7 days. Pt has no form of BC.

## 2024-01-07 NOTE — Progress Notes (Signed)
  CC: Sonata consultation Subjective:    Patient ID: [name], female    DOB: 24-Mar-1986, 38 y.o.   MRN: [id number]  HPI 38 yo G0 seen for discussion of possible Sonata procedure for symptomatic fibroids. She was sent as a referral from Dr. Wynona Hedger. Pt notes regular menses lasting 5-7 days with the first 4 days heavy.  Pt does desire fertility in the future.     Review of Systems     Objective:    Physical Exam Vitals:   01/07/24 1604  BP: 112/75  Pulse: 84   CLINICAL DATA:  R/O FIBROIDS / DYSMENORRHEA   EXAM: ULTRASOUND OF PELVIS   TECHNIQUE: Transabdominal and transvaginalultrasound examination of the pelvis was performed including evaluation of the uterus, ovaries, adnexal regions, and pelvic cul-de-sac.   COMPARISON:  None Available.   FINDINGS: Uterusanteverted, 8 x 6 x 5 cm. The endometrium unremarkable 0.7 cm. The uterine cavity empty with mass effect from fibroids.   The following uterine fibroids were identified.   1. Anterior intramural, 3.8 x 2.8 x 2.3 cm. 2. Posterior submucosal, 3.0 x 1.4 x 1.3 cm.   Right ovary   Unremarkable, 2.9 x 2.5 x 1.7 cm.   Left ovary   Unremarkable, 3.0 x 2.0 x 1.4 cm.   Images of the adnexae demonstrated no masses or fluid collections.   IMPRESSION: Uterine fibroids as described above.  No adnexal pathology.        Assessment & Plan:   1. Menorrhagia with regular cycle (Primary) Pt desires Sonata procedure.  Procedure reviewed in detail on the web site.  Will schedule as soon as possible to reflect pt's need for fertility - Ambulatory Referral For Surgery Scheduling  2. Intramural and submucous leiomyoma of uterus See above - Ambulatory Referral For Surgery Scheduling  I spent 15 minutes dedicated to the care of this patient including previsit review of records, face to face time with the patient discussing treatment options and post visit testing.   Abigail Abler, MD Faculty Attending, Center for  [facility]

## 2024-01-15 LAB — LAB REPORT - SCANNED
EGFR: 83
TSH: 0.67 (ref 0.41–5.90)

## 2024-01-29 ENCOUNTER — Encounter: Admitting: Obstetrics and Gynecology

## 2024-02-03 ENCOUNTER — Telehealth: Payer: Self-pay

## 2024-02-03 NOTE — Telephone Encounter (Signed)
 I called patient to notify her that I received her surgery order and Dr. Racheal Buddle first available is 03/29/24 at 2 pm/[facility]. Patient agreed to be scheduled. I provided surgery details and pre-op information over the phone. Surgery details will be sent to patient's Mychart acct.

## 2024-02-13 ENCOUNTER — Ambulatory Visit: Payer: Self-pay | Admitting: Obstetrics and Gynecology

## 2024-03-24 ENCOUNTER — Other Ambulatory Visit: Payer: Self-pay

## 2024-03-24 ENCOUNTER — Ambulatory Visit: Admitting: Obstetrics and Gynecology

## 2024-03-24 ENCOUNTER — Encounter (HOSPITAL_COMMUNITY): Payer: Self-pay | Admitting: Obstetrics and Gynecology

## 2024-03-24 VITALS — BP 129/87 | HR 85 | Ht 65.0 in | Wt 192.0 lb

## 2024-03-24 DIAGNOSIS — N920 Excessive and frequent menstruation with regular cycle: Secondary | ICD-10-CM

## 2024-03-24 DIAGNOSIS — Z01818 Encounter for other preprocedural examination: Secondary | ICD-10-CM

## 2024-03-24 MED ORDER — MISOPROSTOL 100 MCG PO TABS: ORAL_TABLET | ORAL | 0 refills | Status: DC

## 2024-03-24 NOTE — Progress Notes (Signed)
 Pt states she does take several supplements - should she continue?

## 2024-03-24 NOTE — Progress Notes (Signed)
 OB/GYN Pre-Op History and Physical  [name] is a 38 y.o. G0P0000 presenting for preoperative appointment for Sonata procedure and hysteroscopy. Discussed procedure in detail.  Risks of procedure given including bleeding, infection, involvement of other organs as well as uterine perforation.      No past medical history on file.  Past Surgical History:  Procedure Laterality Date   RETINAL DETACHMENT SURGERY      OB History  Gravida Para Term Preterm AB Living  0 0 0 0 0 0  SAB IAB Ectopic Multiple Live Births  0 0 0 0 0    Social History   Socioeconomic History   Marital status: Single    Spouse name: Not on file   Number of children: Not on file   Years of education: Not on file   Highest education level: Not on file  Occupational History   Not on file  Tobacco Use   Smoking status: Never   Smokeless tobacco: Never  Vaping Use   Vaping status: Never Used  Substance and Sexual Activity   Alcohol use: Yes    Comment: occ   Drug use: Not Currently   Sexual activity: Yes    Birth control/protection: None  Other Topics Concern   Not on file  Social History Narrative   Not on file   Social Drivers of Health   Financial Resource Strain: Not on file  Food Insecurity: Not on file  Transportation Needs: Not on file  Physical Activity: Not on file  Stress: Not on file  Social Connections: Unknown (01/08/2022)   Received from [facility]   Social Network    Social Network: Not on file    Family History  Problem Relation Age of Onset   Stomach cancer Father 50   Breast cancer Maternal Aunt 66       Negative genetic testing   Breast cancer Other 54       maternal half-aunt   Prostate cancer Other        maternal great uncle   Cancer Maternal Great-grandmother        unknown type    (Not in a hospital admission)   Allergies  Allergen Reactions   Oxycodone Hives   Penicillins Hives    Review of Systems: Negative except for what is  mentioned in HPI.     Physical Exam: BP 129/87   Pulse 85   Ht 5' 5 (1.651 m)   Wt 192 lb (87.1 kg)   LMP 03/11/2024 (Approximate)   BMI 31.95 kg/m  CONSTITUTIONAL: Well-developed, well-nourished female in no acute distress.  HENT:  Normocephalic, atraumatic, External right and left ear normal. Oropharynx is clear and moist EYES: Conjunctivae and EOM are normal. NECK: Normal range of motion, supple, no masses SKIN: Skin is warm and dry. No rash noted. Not diaphoretic. No erythema. No pallor. NEUROLGIC: Alert and oriented to person, place, and time. Normal reflexes, muscle tone coordination. No cranial nerve deficit noted. PSYCHIATRIC: Normal mood and affect. Normal behavior. Normal judgment and thought content. CARDIOVASCULAR: Normal heart rate noted, regular rhythm RESPIRATORY: Effort and breath sounds normal, no problems with respiration noted ABDOMEN: Soft, nontender, nondistended,  PELVIC: normal vagina and cervix, nulliparous appearing cervix, closed but not stenotic MUSCULOSKELETAL: Normal range of motion. No edema and no tenderness. 2+ distal pulses.   Pertinent Labs/Studies:   CLINICAL DATA:  R/O FIBROIDS / DYSMENORRHEA   EXAM: ULTRASOUND OF PELVIS   TECHNIQUE: Transabdominal and transvaginalultrasound examination of the pelvis was performed  including evaluation of the uterus, ovaries, adnexal regions, and pelvic cul-de-sac.   COMPARISON:  None Available.   FINDINGS: Uterusanteverted, 8 x 6 x 5 cm. The endometrium unremarkable 0.7 cm. The uterine cavity empty with mass effect from fibroids.   The following uterine fibroids were identified.   1. Anterior intramural, 3.8 x 2.8 x 2.3 cm. 2. Posterior submucosal, 3.0 x 1.4 x 1.3 cm.   Right ovary   Unremarkable, 2.9 x 2.5 x 1.7 cm.   Left ovary   Unremarkable, 3.0 x 2.0 x 1.4 cm.   Images of the adnexae demonstrated no masses or fluid collections.   IMPRESSION: Uterine fibroids as described above.  No  adnexal pathology.         Assessment and Plan :[name] is a 38 y.o. G0P0000 here for Sonata fibroid radioablation with hysteroscopy and fibroid resection.   Plan for Sonata procedure and hysteroscopy NPO Admission labs ordered VS Q4 Risks and benefits previously discussed. Will have cervical ripening with misoprostol .   Jerilynn Buddle, M.D. Attending Obstetrician & Gynecologist, [facility] for [employer], [facility]

## 2024-03-24 NOTE — H&P (View-Only) (Signed)
 OB/GYN Pre-Op History and Physical  [name] is a 38 y.o. G0P0000 presenting for preoperative appointment for Sonata procedure and hysteroscopy. Discussed procedure in detail.  Risks of procedure given including bleeding, infection, involvement of other organs as well as uterine perforation.      No past medical history on file.  Past Surgical History:  Procedure Laterality Date   RETINAL DETACHMENT SURGERY      OB History  Gravida Para Term Preterm AB Living  0 0 0 0 0 0  SAB IAB Ectopic Multiple Live Births  0 0 0 0 0    Social History   Socioeconomic History   Marital status: Single    Spouse name: Not on file   Number of children: Not on file   Years of education: Not on file   Highest education level: Not on file  Occupational History   Not on file  Tobacco Use   Smoking status: Never   Smokeless tobacco: Never  Vaping Use   Vaping status: Never Used  Substance and Sexual Activity   Alcohol use: Yes    Comment: occ   Drug use: Not Currently   Sexual activity: Yes    Birth control/protection: None  Other Topics Concern   Not on file  Social History Narrative   Not on file   Social Drivers of Health   Financial Resource Strain: Not on file  Food Insecurity: Not on file  Transportation Needs: Not on file  Physical Activity: Not on file  Stress: Not on file  Social Connections: Unknown (01/08/2022)   Received from [facility]   Social Network    Social Network: Not on file    Family History  Problem Relation Age of Onset   Stomach cancer Father 50   Breast cancer Maternal Aunt 66       Negative genetic testing   Breast cancer Other 54       maternal half-aunt   Prostate cancer Other        maternal great uncle   Cancer Maternal Great-grandmother        unknown type    (Not in a hospital admission)   Allergies  Allergen Reactions   Oxycodone Hives   Penicillins Hives    Review of Systems: Negative except for what is  mentioned in HPI.     Physical Exam: BP 129/87   Pulse 85   Ht 5' 5 (1.651 m)   Wt 192 lb (87.1 kg)   LMP 03/11/2024 (Approximate)   BMI 31.95 kg/m  CONSTITUTIONAL: Well-developed, well-nourished female in no acute distress.  HENT:  Normocephalic, atraumatic, External right and left ear normal. Oropharynx is clear and moist EYES: Conjunctivae and EOM are normal. NECK: Normal range of motion, supple, no masses SKIN: Skin is warm and dry. No rash noted. Not diaphoretic. No erythema. No pallor. NEUROLGIC: Alert and oriented to person, place, and time. Normal reflexes, muscle tone coordination. No cranial nerve deficit noted. PSYCHIATRIC: Normal mood and affect. Normal behavior. Normal judgment and thought content. CARDIOVASCULAR: Normal heart rate noted, regular rhythm RESPIRATORY: Effort and breath sounds normal, no problems with respiration noted ABDOMEN: Soft, nontender, nondistended,  PELVIC: normal vagina and cervix, nulliparous appearing cervix, closed but not stenotic MUSCULOSKELETAL: Normal range of motion. No edema and no tenderness. 2+ distal pulses.   Pertinent Labs/Studies:   CLINICAL DATA:  R/O FIBROIDS / DYSMENORRHEA   EXAM: ULTRASOUND OF PELVIS   TECHNIQUE: Transabdominal and transvaginalultrasound examination of the pelvis was performed  including evaluation of the uterus, ovaries, adnexal regions, and pelvic cul-de-sac.   COMPARISON:  None Available.   FINDINGS: Uterusanteverted, 8 x 6 x 5 cm. The endometrium unremarkable 0.7 cm. The uterine cavity empty with mass effect from fibroids.   The following uterine fibroids were identified.   1. Anterior intramural, 3.8 x 2.8 x 2.3 cm. 2. Posterior submucosal, 3.0 x 1.4 x 1.3 cm.   Right ovary   Unremarkable, 2.9 x 2.5 x 1.7 cm.   Left ovary   Unremarkable, 3.0 x 2.0 x 1.4 cm.   Images of the adnexae demonstrated no masses or fluid collections.   IMPRESSION: Uterine fibroids as described above.  No  adnexal pathology.         Assessment and Plan :[name] is a 38 y.o. G0P0000 here for Sonata fibroid radioablation with hysteroscopy and fibroid resection.   Plan for Sonata procedure and hysteroscopy NPO Admission labs ordered VS Q4 Risks and benefits previously discussed. Will have cervical ripening with misoprostol .   Jerilynn Buddle, M.D. Attending Obstetrician & Gynecologist, [facility] for [employer], [facility]

## 2024-03-24 NOTE — Progress Notes (Signed)
 SDW CALL  Patient was given pre-op instructions over the phone. The opportunity was given for the patient to ask questions. No further questions asked. Patient verbalized understanding of instructions given.  Date and Arrival time   PCP - Dr. Verena at  [facility] -   PPM/ICD - denies Device Orders - n/a Rep Notified - n/a  Chest x-ray - denies EKG - denies Stress Test - denies ECHO - denies Cardiac Cath - denies  Sleep Study - denies CPAP - n/a  DM -denies  Blood Thinner Instructions: Denies Aspirin Instructions:denies  ERAS Protcol - NPO   COVID TEST- n/a   Anesthesia review: no  Patient denies shortness of breath, fever, cough and chest pain over the phone call   All instructions explained to the patient, with a verbal understanding of the material. Patient agrees to go over the instructions while at home for a better understanding.

## 2024-03-29 ENCOUNTER — Encounter (HOSPITAL_COMMUNITY): Payer: Self-pay | Admitting: Obstetrics and Gynecology

## 2024-03-29 ENCOUNTER — Ambulatory Visit (HOSPITAL_COMMUNITY): Admitting: Anesthesiology

## 2024-03-29 ENCOUNTER — Other Ambulatory Visit: Payer: Self-pay

## 2024-03-29 ENCOUNTER — Encounter (HOSPITAL_COMMUNITY)
Admission: RE | Disposition: A | Discharge status: Home / Self Care | Payer: Self-pay | Source: Home / Self Care | Attending: Obstetrics and Gynecology

## 2024-03-29 ENCOUNTER — Ambulatory Visit (HOSPITAL_COMMUNITY)
Admission: RE | Admit: 2024-03-29 | Discharge: 2024-03-29 | Disposition: A | Discharge status: Home / Self Care | Attending: Obstetrics and Gynecology | Admitting: Obstetrics and Gynecology

## 2024-03-29 DIAGNOSIS — D250 Submucous leiomyoma of uterus: Secondary | ICD-10-CM | POA: Diagnosis not present

## 2024-03-29 DIAGNOSIS — Z01818 Encounter for other preprocedural examination: Secondary | ICD-10-CM

## 2024-03-29 DIAGNOSIS — N920 Excessive and frequent menstruation with regular cycle: Secondary | ICD-10-CM

## 2024-03-29 DIAGNOSIS — Z6831 Body mass index (BMI) 31.0-31.9, adult: Secondary | ICD-10-CM | POA: Insufficient documentation

## 2024-03-29 DIAGNOSIS — D251 Intramural leiomyoma of uterus: Secondary | ICD-10-CM | POA: Diagnosis not present

## 2024-03-29 DIAGNOSIS — E669 Obesity, unspecified: Secondary | ICD-10-CM | POA: Insufficient documentation

## 2024-03-29 DIAGNOSIS — D259 Leiomyoma of uterus, unspecified: Secondary | ICD-10-CM | POA: Diagnosis not present

## 2024-03-29 HISTORY — PX: HYSTEROSCOPY WITH D & C: SHX1775

## 2024-03-29 HISTORY — PX: RADIOFREQUENCY ABLATION, LEIOMYOMA, UTERUS, TRANSCERVICAL APPROACH, WITH US GUIDANCE: SHX7624

## 2024-03-29 HISTORY — PX: MYOSURE RESECTION: SHX7611

## 2024-03-29 LAB — COMPREHENSIVE METABOLIC PANEL WITH GFR
ALT: 16 U/L (ref 0–44)
AST: 20 U/L (ref 15–41)
Albumin: 3.8 g/dL (ref 3.5–5.0)
Alkaline Phosphatase: 49 U/L (ref 38–126)
Anion gap: 9 (ref 5–15)
BUN: 8 mg/dL (ref 6–20)
CO2: 22 mmol/L (ref 22–32)
Calcium: 9.5 mg/dL (ref 8.9–10.3)
Chloride: 103 mmol/L (ref 98–111)
Creatinine, Ser: 0.85 mg/dL (ref 0.44–1.00)
GFR, Estimated: >60 mL/min (ref >60)
Glucose, Bld: 118 mg/dL — ABNORMAL HIGH (ref 70–99)
Potassium: 4 mmol/L (ref 3.5–5.1)
Sodium: 134 mmol/L — ABNORMAL LOW (ref 135–145)
Total Bilirubin: 0.7 mg/dL (ref 0.0–1.2)
Total Protein: 7.8 g/dL (ref 6.5–8.1)

## 2024-03-29 LAB — CBC
HCT: 39.2 % (ref 36.0–46.0)
Hemoglobin: 12.9 g/dL (ref 12.0–15.0)
MCH: 28.7 pg (ref 26.0–34.0)
MCHC: 32.9 g/dL (ref 30.0–36.0)
MCV: 87.3 fL (ref 80.0–100.0)
Platelets: 530 K/uL — ABNORMAL HIGH (ref 150–400)
RBC: 4.49 MIL/uL (ref 3.87–5.11)
RDW: 13.3 % (ref 11.5–15.5)
WBC: 11.3 K/uL — ABNORMAL HIGH (ref 4.0–10.5)
nRBC: 0 % (ref 0.0–0.2)

## 2024-03-29 LAB — TYPE AND SCREEN
ABO/RH(D): O POS
Antibody Screen: NEGATIVE

## 2024-03-29 LAB — POCT PREGNANCY, URINE: Preg Test, Ur: NEGATIVE

## 2024-03-29 LAB — ABO/RH: ABO/RH(D): O POS

## 2024-03-29 SURGERY — RADIOFREQUENCY ABLATION, LEIOMYOMA, UTERUS, TRANSCERVICAL APPROACH, WITH US GUIDANCE
Anesthesia: General | Site: Vagina

## 2024-03-29 MED ORDER — PHENYLEPHRINE 80 MCG/ML (10ML) SYRINGE FOR IV PUSH (FOR BLOOD PRESSURE SUPPORT)
PREFILLED_SYRINGE | INTRAVENOUS | Status: AC
  Filled 2024-03-29: qty 20

## 2024-03-29 MED ORDER — FENTANYL CITRATE (PF) 250 MCG/5ML IJ SOLN
INTRAMUSCULAR | Status: AC
  Filled 2024-03-29: qty 5

## 2024-03-29 MED ORDER — LIDOCAINE HCL (PF) 1 % IJ SOLN
INTRAMUSCULAR | Status: AC
  Filled 2024-03-29: qty 30

## 2024-03-29 MED ORDER — SODIUM CHLORIDE 0.9 % IR SOLN
Status: DC | PRN
  Administered 2024-03-29: 3000 mL

## 2024-03-29 MED ORDER — DEXAMETHASONE SODIUM PHOSPHATE 10 MG/ML IJ SOLN
INTRAMUSCULAR | Status: DC | PRN
  Administered 2024-03-29: 10 mg via INTRAVENOUS

## 2024-03-29 MED ORDER — CELECOXIB 200 MG PO CAPS
200.0000 mg | ORAL_CAPSULE | Freq: Once | ORAL | Status: AC
  Administered 2024-03-29: 200 mg via ORAL
  Filled 2024-03-29: qty 1

## 2024-03-29 MED ORDER — ORAL CARE MOUTH RINSE: 15.0000 mL | Freq: Once | OROMUCOSAL | Status: AC

## 2024-03-29 MED ORDER — LIDOCAINE HCL 1 % IJ SOLN
INTRAMUSCULAR | Status: DC | PRN
  Administered 2024-03-29: 4 mL

## 2024-03-29 MED ORDER — ONDANSETRON HCL 4 MG/2ML IJ SOLN
INTRAMUSCULAR | Status: AC
Start: 2024-03-29 — End: 2024-03-29
  Filled 2024-03-29: qty 2

## 2024-03-29 MED ORDER — VASOPRESSIN 20 UNIT/ML IV SOLN
INTRAVENOUS | Status: DC | PRN
  Administered 2024-03-29: 4 mL via INTRAMUSCULAR

## 2024-03-29 MED ORDER — ONDANSETRON HCL 4 MG/2ML IJ SOLN
INTRAMUSCULAR | Status: DC | PRN
  Administered 2024-03-29: 4 mg via INTRAVENOUS

## 2024-03-29 MED ORDER — IBUPROFEN 800 MG PO TABS: 800.0000 mg | ORAL_TABLET | Freq: Three times a day (TID) | ORAL | 2 refills | Status: AC | PRN

## 2024-03-29 MED ORDER — SODIUM CHLORIDE (PF) 0.9 % IJ SOLN
INTRAMUSCULAR | Status: AC
  Filled 2024-03-29: qty 100

## 2024-03-29 MED ORDER — DEXAMETHASONE SODIUM PHOSPHATE 10 MG/ML IJ SOLN
INTRAMUSCULAR | Status: AC
Start: 2024-03-29 — End: 2024-03-29
  Filled 2024-03-29: qty 1

## 2024-03-29 MED ORDER — LIDOCAINE 2% (20 MG/ML) 5 ML SYRINGE
INTRAMUSCULAR | Status: DC | PRN
  Administered 2024-03-29: 30 mg via INTRAVENOUS

## 2024-03-29 MED ORDER — PROPOFOL 10 MG/ML IV BOLUS
INTRAVENOUS | Status: DC | PRN
  Administered 2024-03-29: 200 mg via INTRAVENOUS

## 2024-03-29 MED ORDER — AMISULPRIDE (ANTIEMETIC) 5 MG/2ML IV SOLN: 10.0000 mg | Freq: Once | INTRAVENOUS | Status: DC | PRN

## 2024-03-29 MED ORDER — ACETAMINOPHEN 500 MG PO TABS
1000.0000 mg | ORAL_TABLET | ORAL | Status: AC
  Administered 2024-03-29: 1000 mg via ORAL
  Filled 2024-03-29: qty 2

## 2024-03-29 MED ORDER — MIDAZOLAM HCL 2 MG/2ML IJ SOLN
INTRAMUSCULAR | Status: AC
  Filled 2024-03-29: qty 2

## 2024-03-29 MED ORDER — LACTATED RINGERS IV SOLN: INTRAVENOUS | Status: DC

## 2024-03-29 MED ORDER — MIDAZOLAM HCL 2 MG/2ML IJ SOLN
INTRAMUSCULAR | Status: DC | PRN
  Administered 2024-03-29: 2 mg via INTRAVENOUS

## 2024-03-29 MED ORDER — LIDOCAINE 2% (20 MG/ML) 5 ML SYRINGE
INTRAMUSCULAR | Status: AC
  Filled 2024-03-29: qty 5

## 2024-03-29 MED ORDER — SOD CITRATE-CITRIC ACID 500-334 MG/5ML PO SOLN
30.0000 mL | ORAL | Status: AC
  Administered 2024-03-29: 30 mL via ORAL
  Filled 2024-03-29: qty 30

## 2024-03-29 MED ORDER — FENTANYL CITRATE (PF) 100 MCG/2ML IJ SOLN: 25.0000 ug | INTRAMUSCULAR | Status: DC | PRN

## 2024-03-29 MED ORDER — SODIUM CHLORIDE 0.9 % IV SOLN: 12.5000 mg | INTRAVENOUS | Status: DC | PRN

## 2024-03-29 MED ORDER — KETOROLAC TROMETHAMINE 30 MG/ML IJ SOLN
INTRAMUSCULAR | Status: AC
  Filled 2024-03-29: qty 1

## 2024-03-29 MED ORDER — CHLORHEXIDINE GLUCONATE 0.12 % MT SOLN
15.0000 mL | Freq: Once | OROMUCOSAL | Status: AC
  Administered 2024-03-29: 15 mL via OROMUCOSAL
  Filled 2024-03-29: qty 15

## 2024-03-29 MED ORDER — PROPOFOL 10 MG/ML IV BOLUS
INTRAVENOUS | Status: AC
  Filled 2024-03-29: qty 20

## 2024-03-29 MED ORDER — POVIDONE-IODINE 10 % EX SWAB
2.0000 | Freq: Once | CUTANEOUS | Status: AC
  Administered 2024-03-29: 2 via TOPICAL

## 2024-03-29 MED ORDER — KETOROLAC TROMETHAMINE 30 MG/ML IJ SOLN
INTRAMUSCULAR | Status: DC | PRN
  Administered 2024-03-29: 30 mg via INTRAVENOUS

## 2024-03-29 MED ORDER — FENTANYL CITRATE (PF) 250 MCG/5ML IJ SOLN
INTRAMUSCULAR | Status: DC | PRN
  Administered 2024-03-29 (×3): 25 ug via INTRAVENOUS

## 2024-03-29 MED ORDER — ACETAMINOPHEN 500 MG PO TABS: 1000.0000 mg | ORAL_TABLET | Freq: Once | ORAL | Status: DC

## 2024-03-29 SURGICAL SUPPLY — 26 items
BAG COUNTER SPONGE SURGICOUNT (BAG) ×3 IMPLANT
CATH ROBINSON RED A/P 14FR (CATHETERS) ×1 IMPLANT
COVER MAYO STAND STRL (DRAPES) ×3 IMPLANT
DEVICE MYOSURE LITE (MISCELLANEOUS) IMPLANT
DEVICE MYOSURE REACH (MISCELLANEOUS) IMPLANT
ELECT DISPERSIVE SONATA (ELECTRODE) ×6 IMPLANT
GAUZE 4X4 16PLY ~~LOC~~+RFID DBL (SPONGE) ×3 IMPLANT
GLOVE BIOGEL PI IND STRL 7.5 (GLOVE) ×2 IMPLANT
GLOVE SURG ORTHO 8.0 STRL STRW (GLOVE) ×3 IMPLANT
GLOVE SURG UNDER POLY LF SZ7 (GLOVE) ×6 IMPLANT
GOWN STRL REUS W/ TWL XL LVL3 (GOWN DISPOSABLE) ×6 IMPLANT
HANDPIECE RFA SONATA (MISCELLANEOUS) ×3 IMPLANT
KIT PROCEDURE FLUENT (KITS) ×3 IMPLANT
KIT TURNOVER KIT B (KITS) ×3 IMPLANT
NDL ASPIRATION 22 (NEEDLE) IMPLANT
NEEDLE ASPIRATION 22 (NEEDLE) ×3 IMPLANT
PACK VAGINAL MINOR WOMEN LF (CUSTOM PROCEDURE TRAY) ×3 IMPLANT
PAD OB MATERNITY 11 LF (PERSONAL CARE ITEMS) ×3 IMPLANT
SEAL ROD LENS SCOPE MYOSURE (ABLATOR) ×3 IMPLANT
SLEEVE SCD COMPRESS KNEE MED (STOCKING) ×3 IMPLANT
SOL .9 NS 3000ML IRR UROMATIC (IV SOLUTION) ×1 IMPLANT
SYR 3ML 23GX1 SAFETY (SYRINGE) ×1 IMPLANT
SYSTEM TISS REMOVAL MYOSURE XL (MISCELLANEOUS) ×1 IMPLANT
TOWEL GREEN STERILE FF (TOWEL DISPOSABLE) ×6 IMPLANT
TRAY FOLEY W/BAG SLVR 14FR (SET/KITS/TRAYS/PACK) ×3 IMPLANT
UNDERPAD 30X36 HEAVY ABSORB (UNDERPADS AND DIAPERS) ×3 IMPLANT

## 2024-03-29 NOTE — Anesthesia Procedure Notes (Signed)
 Procedure Name: LMA Insertion Date/Time: 03/29/2024 3:11 PM  Performed by: Evette Ade, CRNAPre-anesthesia Checklist: Patient identified, Emergency Drugs available, Suction available, Timeout performed and Patient being monitored Patient Re-evaluated:Patient Re-evaluated prior to induction Oxygen Delivery Method: Circle system utilized Preoxygenation: Pre-oxygenation with 100% oxygen Induction Type: IV induction LMA: LMA inserted LMA Size: 4.0 Number of attempts: 1 Placement Confirmation: positive ETCO2 and breath sounds checked- equal and bilateral Tube secured with: Tape Dental Injury: Teeth and Oropharynx as per pre-operative assessment

## 2024-03-29 NOTE — Anesthesia Postprocedure Evaluation (Signed)
 Anesthesia Post Note  Patient: [name], engineering  Procedure(s) Performed: RADIOFREQUENCY ABLATION, LEIOMYOMA, UTERUS, TRANSCERVICAL APPROACH, WITH US  GUIDANCE (Vagina ) MYOSURE RESECTION (Vagina ) HYSTEROSCOPY WITH MYOSURE (Vagina )     Patient location during evaluation: PACU Anesthesia Type: General Level of consciousness: awake and alert Pain management: pain level controlled Vital Signs Assessment: post-procedure vital signs reviewed and stable Respiratory status: spontaneous breathing, nonlabored ventilation, respiratory function stable and patient connected to nasal cannula oxygen Cardiovascular status: blood pressure returned to baseline and stable Postop Assessment: no apparent nausea or vomiting Anesthetic complications: no   No notable events documented.  Last Vitals:  Vitals:   03/29/24 1700 03/29/24 1715  BP: 120/65   Pulse: 80   Resp: 17   Temp:  36.7 C  SpO2: 96%     Last Pain:  Vitals:   03/29/24 1715  TempSrc:   PainSc: 2    Pain Goal: Patients Stated Pain Goal: 2 (03/29/24 1715)                 [name] L [name]

## 2024-03-29 NOTE — Op Note (Signed)
 PREOPERATIVE DIAGNOSIS: Symptomatic uterine fibroids, menorrhagia POSTOPERATIVE DIAGNOSIS: The same PROCEDURE: Transcervical Sonata uterine fibroid radiofrequency ablation, Hysteroscopy, myosure fibroid resection. SURGEON:  Dr. Jerilynn Buddle   INDICATIONS: 38 y.o. G0P0000  here for scheduled surgery for the aforementioned diagnoses.   Risks of surgery were discussed with the patient including but not limited to: bleeding which may require transfusion; infection which may require antibiotics; injury to uterus or surrounding organs; intrauterine scarring which may impair future fertility; need for additional procedures including laparotomy or laparoscopy; and other postoperative/anesthesia complications. Written informed consent was obtained.    FINDINGS:  A 9 week size uterus.   Fibroid A: 3.5 cm at 12 o'clock Fibroid B: 2 cm at 3 o'clock Fibroid C: 1.5 cm at 6 o'clock Fibroid D: 2.5 cm at 9 o'clock ANESTHESIA:   General, paracervical block with 10 ml of 0.5% Marcaine INTRAVENOUS FLUIDS:  600 ml of LR FLUID DEFICITS:  245 ml of NS ESTIMATED BLOOD LOSS:  Less than 30 ml SPECIMENS: fragments of submucosal fibroid COMPLICATIONS:  None immediate.  PROCEDURE DETAILS:  After administering anesthesia, the patient was identified and a time out was performed and procedure was confirmed.  The patient was placed in dorsal lithotomy position and an exam under anesthesia revealed a mobile fibroid uterus and normal adnexa.  A speculum was placed, cervix was grasped with a tenaculum.  The cervix was easily dilated to a #25 Pratt dilator.  The operative hysteroscope was inserted and the uterine cavity was visualized in its entirety.  Both tubal ostia were noted to be normal.   The uterine cavity was notable for a large submucosal fibroid extending from the anterior patient's right uterine cavity.  Dilute vasopressin  was injected into the fibroid and the myosure Reach device was used to resect the majority of the  fibroid.  The remainder of the cavity appeared normal.  The Sonata handpiece was then inserted and a complete uterine survey was performed with the ultrasound.  Sonata fibroid ablation was then performed.  Fibroid A: ablation zone 2.5 x 1.8 cm for 2 min 12 seconds                  Ablation zone 2 : 2.3 x 1.56 cm for 1 minute 42 seconds  Fibroid B: ablation zone 1: 2.0 x 1.3 cm for 1 minute                 Ablation zone 2: 2.3 x 1.6 cm for  1 minute 42 seconds  Fibroid C: ablation zone: 1.9 x 1.3 cm for 50 seconds  Fibroid D: ablation zone 1: 2.6 cm x 1.9 cm for 2 minutes 18 seconds                 Ablation zone 2 2.4 x 1.7 cm for one minute 54 seconds   All cycles were carried out under direct ultrasound intrauterine guidance and visualization with the ablation guide noted to be within the serosa at all times.  The fibroids appeared ablated with u/s guidance and outgassing noted by U/S appearance. (  Sponge, needle, and instrument account was correct x 2.  All instruments were removed from the vagina.  Hemostasis was assured.  The patient was then awakened and taken to the recovery room I stable condition, tolerating the procedure well.  The patient will be discharged to home as per PACU criteria.  Routine postoperative instructions given.  She was prescribed Ibuprofen .  She will follow up in the clinic in 2-3  weeks  for postoperative evaluation.   Jerilynn Buddle, MD, FACOG Obstetrician & Gynecologist, [facility] for [facility], [facility]

## 2024-03-29 NOTE — Transfer of Care (Signed)
 Immediate Anesthesia Transfer of Care Note  Patient: [name]  Procedure(s) Performed: RADIOFREQUENCY ABLATION, LEIOMYOMA, UTERUS, TRANSCERVICAL APPROACH, WITH US  GUIDANCE (Vagina ) MYOSURE RESECTION (Vagina ) HYSTEROSCOPY WITH MYOSURE (Vagina )  Patient Location: PACU  Anesthesia Type:General  Level of Consciousness: drowsy  Airway & Oxygen Therapy: Patient Spontanous Breathing  Post-op Assessment: Report given to RN and Post -op Vital signs reviewed and stable  Post vital signs: Reviewed and stable  Last Vitals:  Vitals Value Taken Time  BP 114/59 03/29/24 16:48  Temp 36.4 C 03/29/24 16:45  Pulse 77 03/29/24 16:52  Resp 18 03/29/24 16:52  SpO2 99 % 03/29/24 16:52  Vitals shown include unfiled device data.  Last Pain:  Vitals:   03/29/24 1645  TempSrc:   PainSc: Asleep      Patients Stated Pain Goal: 4 (03/29/24 1128)  Complications: No notable events documented.

## 2024-03-29 NOTE — Anesthesia Preprocedure Evaluation (Addendum)
 Anesthesia Evaluation  Patient identified by MRN, date of birth, ID band Patient awake    Reviewed: Allergy & Precautions, NPO status , Patient's Chart, lab work & pertinent test results  History of Anesthesia Complications Negative for: history of anesthetic complications  Airway Mallampati: II  TM Distance: >3 FB Neck ROM: Full    Dental  (+) Dental Advisory Given   Pulmonary neg pulmonary ROS   Pulmonary exam normal        Cardiovascular negative cardio ROS Normal cardiovascular exam     Neuro/Psych negative neurological ROS  negative psych ROS   GI/Hepatic negative GI ROS, Neg liver ROS,,,  Endo/Other   Obesity   Renal/GU negative Renal ROS     Musculoskeletal negative musculoskeletal ROS (+)    Abdominal   Peds  Hematology negative hematology ROS (+)   Anesthesia Other Findings   Reproductive/Obstetrics                              Anesthesia Physical Anesthesia Plan  ASA: 2  Anesthesia Plan: General   Post-op Pain Management: Tylenol  PO (pre-op)* and Celebrex  PO (pre-op)*   Induction: Intravenous  PONV Risk Score and Plan: 3 and Treatment may vary due to age or medical condition, Ondansetron , Dexamethasone  and Midazolam   Airway Management Planned: LMA  Additional Equipment: None  Intra-op Plan:   Post-operative Plan: Extubation in OR  Informed Consent: I have reviewed the patients History and Physical, chart, labs and discussed the procedure including the risks, benefits and alternatives for the proposed anesthesia with the patient or authorized representative who has indicated his/her understanding and acceptance.     Dental advisory given  Plan Discussed with: CRNA and Anesthesiologist  Anesthesia Plan Comments:          Anesthesia Quick Evaluation

## 2024-03-29 NOTE — Interval H&P Note (Signed)
 History and Physical Interval Note:  03/29/2024 12:30 PM  [name]  has presented today for surgery, with the diagnosis of Menorrhagia with regular cycle Intramural and submucous leiomyoma of uterus.  The various methods of treatment have been discussed with the patient and family. After consideration of risks, benefits and other options for treatment, the patient has consented to  Procedure(s) with comments: RADIOFREQUENCY ABLATION, LEIOMYOMA, UTERUS, TRANSCERVICAL APPROACH, WITH US  GUIDANCE (N/A) ABLATION, ENDOMETRIUM, HYSTEROSCOPIC (N/A) - MYOSURE MYOSURE RESECTION (N/A) as a surgical intervention.  The patient's history has been reviewed, patient examined, no change in status, stable for surgery.  I have reviewed the patient's chart and labs.  Questions were answered to the patient's satisfaction.     Jerilynn DELENA Buddle

## 2024-03-30 ENCOUNTER — Encounter (HOSPITAL_COMMUNITY): Payer: Self-pay | Admitting: Obstetrics and Gynecology

## 2024-03-31 ENCOUNTER — Ambulatory Visit: Payer: Self-pay | Admitting: Obstetrics and Gynecology

## 2024-03-31 LAB — SURGICAL PATHOLOGY

## 2024-04-21 ENCOUNTER — Ambulatory Visit (INDEPENDENT_AMBULATORY_CARE_PROVIDER_SITE_OTHER): Admitting: Obstetrics and Gynecology

## 2024-04-21 ENCOUNTER — Encounter: Payer: Self-pay | Admitting: Obstetrics and Gynecology

## 2024-04-21 VITALS — BP 128/77 | HR 71 | Ht 65.0 in | Wt 182.8 lb

## 2024-04-21 DIAGNOSIS — Z4889 Encounter for other specified surgical aftercare: Secondary | ICD-10-CM | POA: Diagnosis not present

## 2024-04-21 NOTE — Progress Notes (Signed)
    Subjective:    [name] is a 38 y.o. female who presents to the clinic status post hysteroscopy, fibroid resection and Sonata ablation on 03/29/24. The patient is not having any pain.  Eating a regular diet without difficulty. Bowel movements are normal. No other significant postoperative concerns.  The following portions of the patient's history were reviewed and updated as appropriate: allergies, current medications, past family history, past medical history, past social history, past surgical history, and problem list..  Last pap smear was normal on 05/26/23.  Review of Systems Pertinent items are noted in HPI.   Objective:   BP 128/77   Pulse 71   Ht 5' 5 (1.651 m)   Wt 182 lb 12.8 oz (82.9 kg)   LMP 04/04/2024 (Approximate)   BMI 30.42 kg/m  Constitutional:  Well-developed, well-nourished female in no acute distress.   Skin: Skin is warm and dry, no rash noted, not diaphoretic,no erythema, no pallor.  Cardiovascular: Normal heart rate noted  Respiratory: Effort and breath sounds normal, no problems with respiration noted  Abdomen: Soft, bowel sounds active, non-tender, no abnormal masses  Incision: N/a  Pelvic:   Deferred   Surgical pathology () Fragments c/w benign fibroid Assessment:   Doing well postoperatively.  Operative findings again reviewed. Pathology report discussed.   Plan:   1. Continue any current medications. 2. Wound care discussed. 3. Activity restrictions: none 4. Anticipated return to work: now. 5. Follow up as needed 6.  Routine preventative health maintenance measures emphasized. Please refer to After Visit Summary for other counseling recommendations.    Jerilynn Buddle, MD, FACOG Attending Obstetrician & Gynecologist Center for [facility], [facility]

## 2024-04-21 NOTE — Progress Notes (Signed)
 Pt presents for post-op 03/29/24. No pain. Current dark discharge, has lightened. Spotting has stopped.
